# Patient Record
Sex: Male | Born: 1950 | Race: White | Hispanic: No | Marital: Single
Health system: Southern US, Community
[De-identification: ages and names within clinical notes are randomized; demographics above are authoritative.]

## PROBLEM LIST (undated history)

## (undated) DIAGNOSIS — J9621 Acute and chronic respiratory failure with hypoxia: Secondary | ICD-10-CM

## (undated) DIAGNOSIS — N170 Acute kidney failure with tubular necrosis: Secondary | ICD-10-CM

## (undated) DIAGNOSIS — G4733 Obstructive sleep apnea (adult) (pediatric): Secondary | ICD-10-CM

## (undated) DIAGNOSIS — J849 Interstitial pulmonary disease, unspecified: Secondary | ICD-10-CM

## (undated) DIAGNOSIS — J449 Chronic obstructive pulmonary disease, unspecified: Secondary | ICD-10-CM

## (undated) DIAGNOSIS — J4489 Other specified chronic obstructive pulmonary disease: Secondary | ICD-10-CM

## (undated) DIAGNOSIS — J84111 Idiopathic interstitial pneumonia, not otherwise specified: Secondary | ICD-10-CM

---

## 1998-05-04 ENCOUNTER — Ambulatory Visit (HOSPITAL_COMMUNITY): Admission: RE | Admit: 1998-05-04 | Discharge: 1998-05-04 | Payer: Self-pay | Admitting: Neurosurgery

## 1998-05-04 ENCOUNTER — Encounter: Payer: Self-pay | Admitting: Neurosurgery

## 1998-05-05 ENCOUNTER — Encounter: Payer: Self-pay | Admitting: Neurosurgery

## 1998-05-25 ENCOUNTER — Encounter: Payer: Self-pay | Admitting: Neurosurgery

## 1998-05-29 ENCOUNTER — Observation Stay (HOSPITAL_COMMUNITY): Admission: RE | Admit: 1998-05-29 | Discharge: 1998-05-29 | Payer: Self-pay | Admitting: Neurosurgery

## 1998-05-29 ENCOUNTER — Encounter: Payer: Self-pay | Admitting: Neurosurgery

## 2007-04-12 ENCOUNTER — Encounter: Admission: RE | Admit: 2007-04-12 | Discharge: 2007-04-12 | Payer: Self-pay | Admitting: Neurosurgery

## 2007-05-25 ENCOUNTER — Inpatient Hospital Stay (HOSPITAL_COMMUNITY): Admission: RE | Admit: 2007-05-25 | Discharge: 2007-05-28 | Payer: Self-pay | Admitting: Neurosurgery

## 2007-07-28 ENCOUNTER — Encounter: Admission: RE | Admit: 2007-07-28 | Discharge: 2007-07-28 | Payer: Self-pay | Admitting: Neurosurgery

## 2007-08-04 ENCOUNTER — Encounter: Admission: RE | Admit: 2007-08-04 | Discharge: 2007-08-04 | Payer: Self-pay | Admitting: Neurosurgery

## 2007-10-18 ENCOUNTER — Encounter: Admission: RE | Admit: 2007-10-18 | Discharge: 2007-10-18 | Payer: Self-pay | Admitting: Neurosurgery

## 2008-07-07 ENCOUNTER — Encounter: Admission: RE | Admit: 2008-07-07 | Discharge: 2008-07-07 | Payer: Self-pay | Admitting: Neurosurgery

## 2009-08-29 IMAGING — CT CT L SPINE W/ CM
4 of 8 series · 14 of 33 positions shown, 16 images · IV contrast (omnipaque)
Comparison: 08/04/2007 CT

CLINICAL DATA: Low back pain.  Right leg pain.

 MYELOGRAM INJECTION
TECHNIQUE: Informed consent was obtained from the patient prior to
the procedure, including potential complications of headache,
allergy, infection and pain.  A timeout procedure was performed.
With the patient prone, the lower back was prepped with Betadine.
1% Lidocaine was used for local anesthesia.  Lumbar puncture was
performed at the right L1-7level using a 22 gauge needle with
return of clear CSF.  18cc of Omnipaque 474was injected into the
subarachnoid space .
TECHNIQUE: Following injection of intrathecal Omnipaque contrast,
spine imaging in multiple projections was performed using
fluoroscopy.
Fluoroscopy Time: 2 minutes 52 seconds .
TECHNIQUE: CT imaging of the lumbar spine was performed after
intrathecal contrast administration.  Multiplanar CT image
reconstructions were also generated.

[Series 2: l spine · axial · 0.27mm/px · z∈[-270,-155]mm · 3 of 92 slices shown, 4 images]
[im 23/92  soft-tissue]
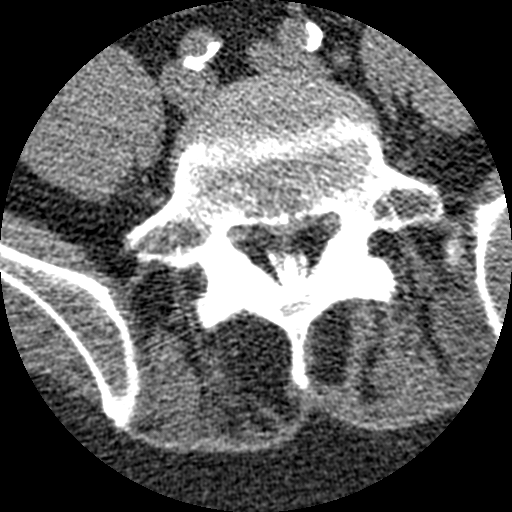
[im 23/92  bone]
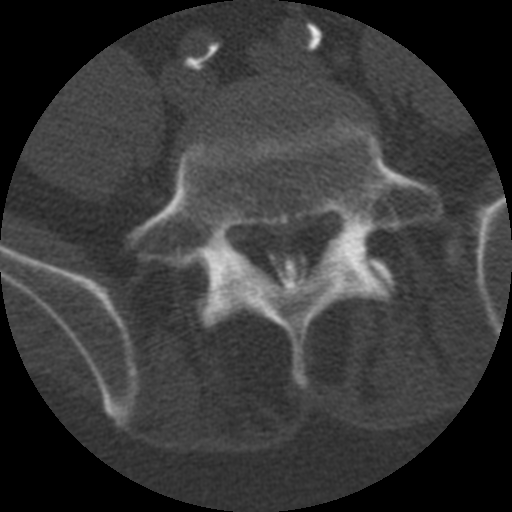
[im 46/92  bone]
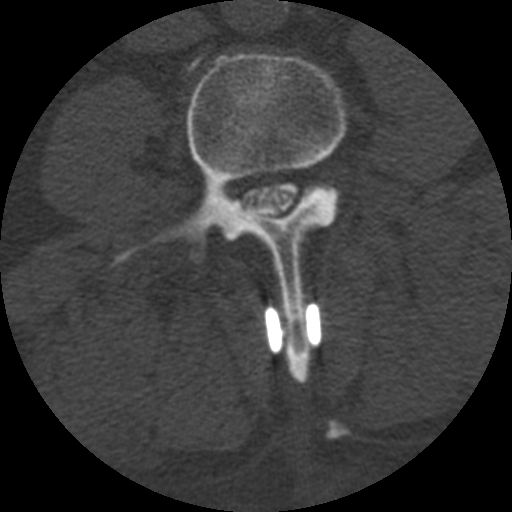
[im 69/92  bone]
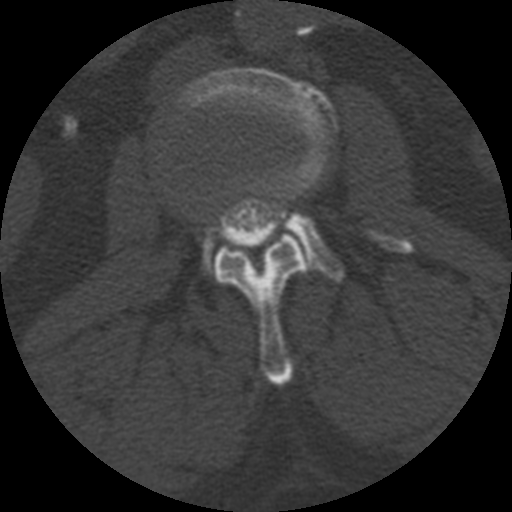

[Series 3: bone windows · axial · 0.27mm/px · z∈[-270,-155]mm · 3 of 92 slices shown]
[im 23/92  bone]
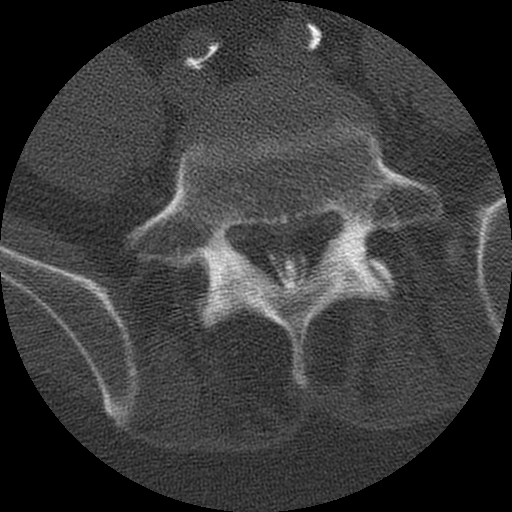
[im 46/92  bone]
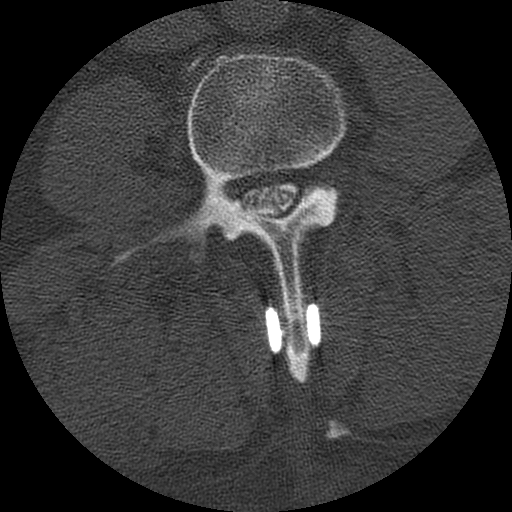
[im 69/92  bone]
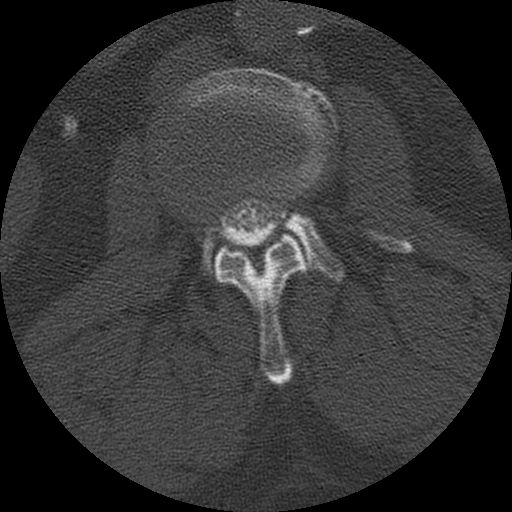

[Series 103: cor · coronal · 0.46mm/px · 3 of 39 slices shown]
[im 8/39  bone]
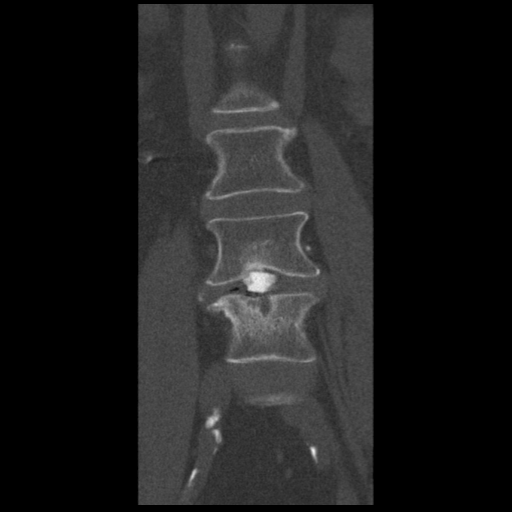
[im 16/39  bone]
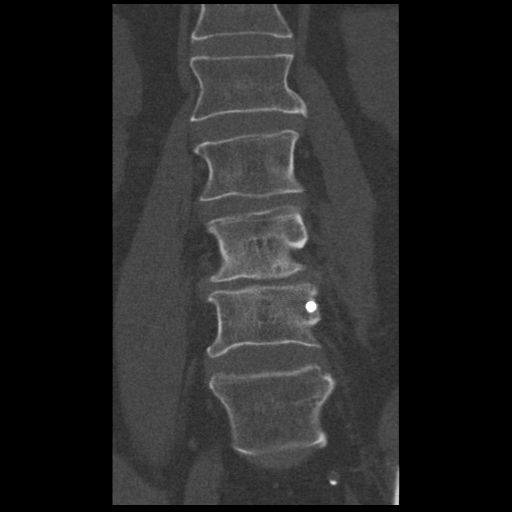
[im 23/39  bone]
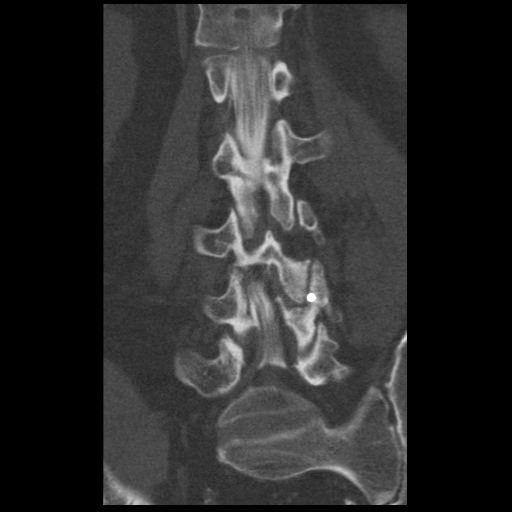

[Series 104: sag · sagittal · 0.46mm/px · 5 of 39 slices shown, 6 images]
[im 13/39  bone]
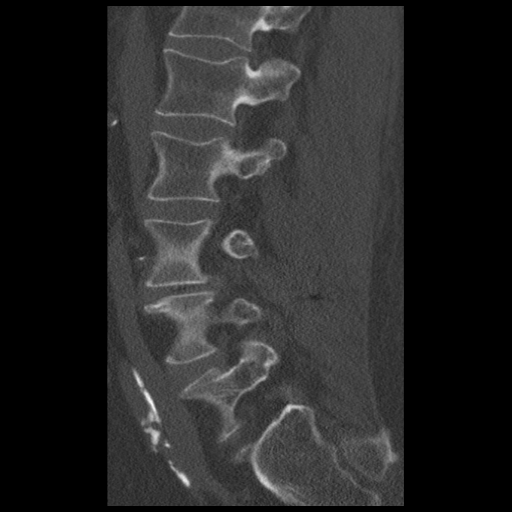
[im 16/39  bone]
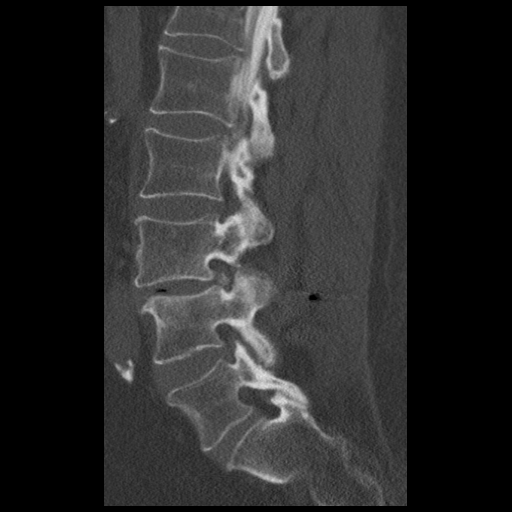
[im 20/39  soft-tissue]
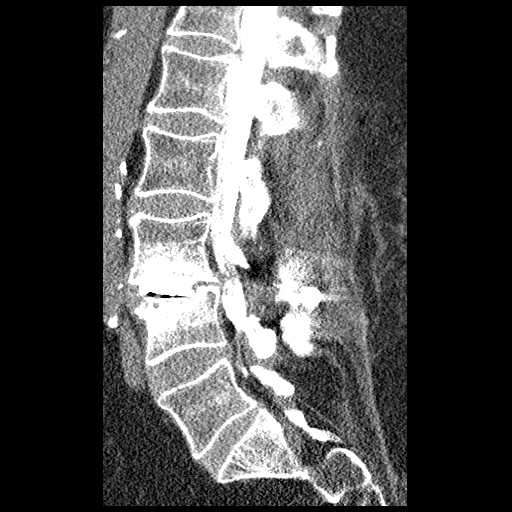
[im 20/39  bone]
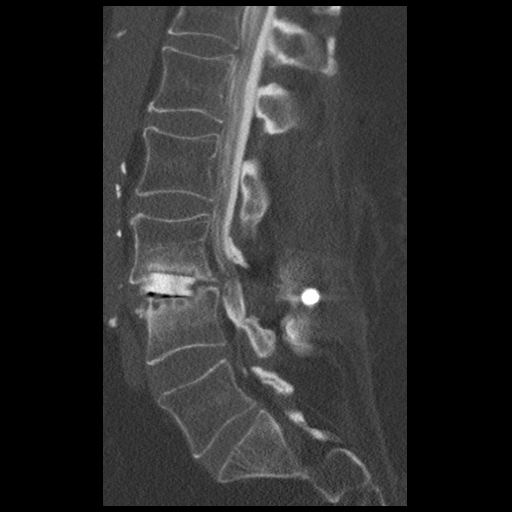
[im 23/39  bone]
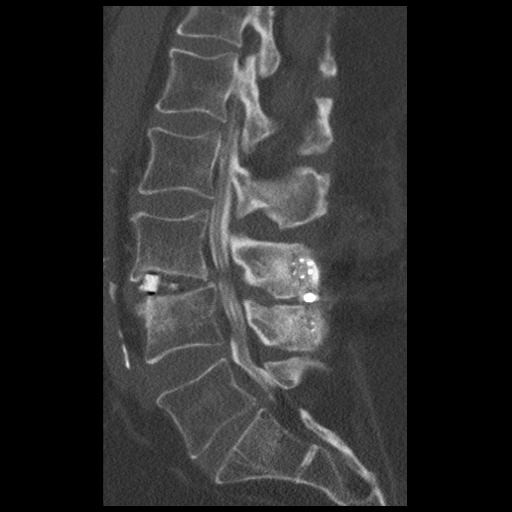
[im 26/39  bone]
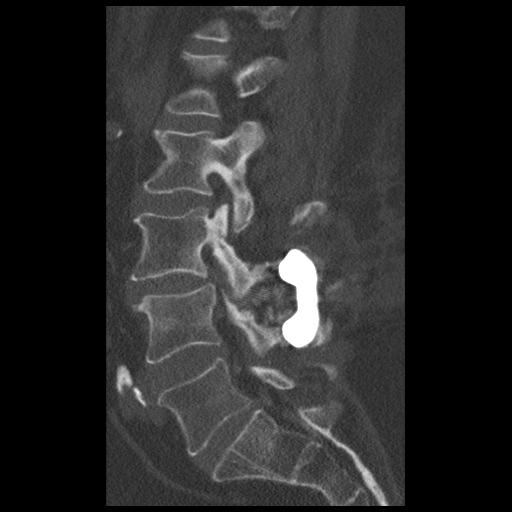

[14 of 33 positions shown; findings below may reference images not displayed]

IMPRESSION: Successful injection of  intrathecal contrast for myelography.

MYELOGRAM LUMBAR
FINDINGS: There is no compressive pathology seen at L2-3 or above.

At L3-4, there is an interspinous device in there is a trans facet
old screw on the left.  Interbody fusion material is in place but
there appears to be lucency in that region suggesting nonunion.
There is anterolisthesis of 3 mm.  There is an anterior extradural
defect.  There is moderate multifactorial stenosis at this level.

At L4-5, there is a small anterior extradural defect but no
apparent compressive stenosis.

At L5-S1, there is a small anterior extradural defect but no
apparent compressive stenosis.

Standing lateral flexion extension views show motion with rocking
at the L3-4 level indicating nonunion.
IMPRESSION: Evidence of nonunion at L4-5.  Multifactorial spinal stenosis.

Small anterior still defect at L4-5 without gross neural
compression being evident at myelography.

CT MYELOGRAPHY LUMBAR SPINE
FINDINGS: T12-L1:  Normal interspace

L1-2:  Normal interspace

L2-3:  Minimal disc bulge.  No compressive stenosis.

L3-4:  Apparent nonunion.  Nitrogen gas adjacent to the interbody
fusion material.  Cystic changes at the endplates adjacent to the
interbody material.  Transfacetal screw on the left shows no
lucency with respect to its extent within L4 but there is lucency
around the posterior aspect of the screw at the inferior facet of
L3.  Interspinous device is in place and appears unremarkable.
There are endplate osteophytes and there is circumferential
protrusion of disc material.  There is been right hemilaminectomy
at this level.  Despite this, there is spinal stenosis that could
well cause neural compression.  Foraminal stenosis on the right
could well compress the L3 nerve root.  There is also foraminal
narrowing on the left though not as severe.

L4-5:  Circumferential bulging of the disc.  Facet and ligamentous
hypertrophy bilaterally.  Narrowing of the lateral recesses without
definite neural compression.

L5-S1:  No significant disc or facet pathology.  No compressive
stenosis.
IMPRESSION: Multifactorial spinal stenosis at L3-4.  Anterolisthesis of 3 mm.
Endplate osteophytes.  Broad-based protrusion of disc material more
towards the right.  Despite previous right hemilaminectomy, neural
compression seems likely at this level.  There is neural foraminal
stenosis right worse than left.  There appears to be nonunion with
nitrogen gas along the margins of the interbody fusion material and
cystic changes within the endplates adjacent to the interbody
material.  Lucency around the portion of the left-sided
transfacetal screw with respect to the inferior articular process
of L3.

Mild narrowing of the lateral recesses at L4-5 without gross neural
compression.

## 2010-10-01 NOTE — Op Note (Signed)
Clinton Bowman, Clinton Bowman              ACCOUNT NO.:  [id number]   MEDICAL RECORD NO.:  [id number]          PATIENT TYPE:  INP   LOCATION:  3172                         FACILITY:  [facility]   PHYSICIAN:  Reinaldo Meeker, M.D. 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   POSTOPERATIVE DIAGNOSIS:  Spondylolisthesis with lateral recess  stenosis, L3-4 right.   PROCEDURE:  Right L3-4 transverse lumbar interbody fusion with NuVasive  bony spacer followed by left transfacet screw fixation and spinous  process instrumentation, L3-4, followed by L3-4 posterolateral fusion.   SURGEON:  Reinaldo Meeker, M.D.   ASSISTANT:  Kathaleen Maser. Pool, M.D.   SURGICAL INDICATIONS:  Mr. Ake is a 60 year old gentleman who has  had a foraminotomy and diskectomy at L3-4 on the right.  He did well  until recently when he developed recurrent back and right leg pain.  He  was tried on conservative therapy without improvement.  He underwent  studies which showed a grade 1 listhesis at L3-4 with lateral recess  stenosis.  After discussing the options the patient requested surgical  intervention and is admitted at this time for L3-4 transverse lumbar  interbody fusion with instrumentation.   PROCEDURE IN DETAIL:  After being placed in the prone position the  patient's back was prepped and draped in usual sterile fashion.  Localizing fluoroscopy was used prior to incision to identify the  appropriate level.  Midline incision was made above the spinous process  of L3 and L4.  Using Bovie cutting current the incision was carried down  to the spinous processes.  Subperiosteal dissection was carried out  bilaterally on the spinous processes, lamina and facet joints.  A self-  retaining retractor was placed for exposure.  X-rays showed approach the  appropriate level.  On the patient's right side edges of the previous  laminotomy were identified, dissected free with a curette.  High-speed  drill was used to widen the laminotomy until the inferior three-quarters  of the L3 lamina, the medial three-quarters of facet joint and the  superior one-half of the L4 lamina were removed.  Residual bone,  ligamentum flavum and scar tissue were removed to identify the L3 and L4  nerve roots until they were well visualized and decompressed.  There was  a rent in the annulus and this was enlarged with a #15 blade and the  disk space was thoroughly cleaned out with pituitary rongeurs and  curettes.  The disk space was then prepared for transverse lumbar body  fusion.  It was distracted up to 10 mm size and then the bony edges were  decorticated with a variety of curettes and rotating cutters.  Prior to  placing in a 10 x 11 x 25 mm bone, autologous bone, Actifuse putty and  BMP were placed deep within the interspace.  This was followed by a  large bony spacer which was followed in good position under fluoroscopy.  At this time,  transfacet screw fixation was carried on the patient's  left side.  Drill hole entry point was made followed by passing a drill  over a guidewire through the inferior facet of L3, the pedicle of L4.  This was followed under AP and lateral fluoroscopy and intraoperative  EMG monitoring was carried out as well and showed no evidence of breech  of the pedicle.  Tapping was then carried out followed by placement of a  35 mm transfacet screw which followed in good position under  fluoroscopy.  A spinous process plate was then selected of the  appropriate size and compressed into position without difficulty.  Final  fluoroscopy in AP and lateral direction showed the interbody spacer,  transfacet screw and spinous process plate to all be in good position.  Large amounts of irrigation were carried out at this time.  The lamina  and  facet joint on the left at L3-4 was decorticated and BMP autologous  bone and Actifuse putty were placed in that area.  Gelfoam was placed  over the dural exposure on the opposite side.  A Hemovac drain was left  in the epidural and extralaminar spaces bilaterally and brought through  a separate stab wound incision.  The wound was then closed in multiple  layers of Vicryl in the muscle, fascia, subcutaneous and subcu tissues  and staples were placed on the skin.  A sterile dressing was then  applied and the patient was extubated, taken to the recovery room in  stable condition.           ______________________________  Reinaldo Meeker, M.D.     ROK/MEDQ  D:  05/25/2007  T:  05/25/2007  Job:  [id number]

## 2011-02-21 LAB — CBC
MCV: 92.7
RBC: 5.03
WBC: 6.8

## 2011-02-21 LAB — PROTIME-INR
INR: 0.9
Prothrombin Time: 11.9

## 2011-02-21 LAB — BASIC METABOLIC PANEL
CO2: 23
Calcium: 9.6
Potassium: 4.8
Sodium: 136

## 2011-02-21 LAB — TYPE AND SCREEN
ABO/RH(D): O POS
Antibody Screen: NEGATIVE

## 2018-11-03 ENCOUNTER — Ambulatory Visit (HOSPITAL_COMMUNITY)
Admission: AD | Admit: 2018-11-03 | Discharge: 2018-11-03 | Disposition: A | Discharge status: Home / Self Care | Payer: Self-pay | Source: Other Acute Inpatient Hospital | Attending: Internal Medicine | Admitting: Internal Medicine

## 2018-11-03 ENCOUNTER — Inpatient Hospital Stay
Admission: RE | Admit: 2018-11-03 | Discharge: 2018-11-25 | Disposition: A | Discharge status: Skilled Nursing Facility | Payer: Medicare HMO | Source: Other Acute Inpatient Hospital | Attending: Internal Medicine | Admitting: Internal Medicine

## 2018-11-03 DIAGNOSIS — R0902 Hypoxemia: Secondary | ICD-10-CM

## 2018-11-03 DIAGNOSIS — J9690 Respiratory failure, unspecified, unspecified whether with hypoxia or hypercapnia: Secondary | ICD-10-CM | POA: Insufficient documentation

## 2018-11-03 DIAGNOSIS — N170 Acute kidney failure with tubular necrosis: Secondary | ICD-10-CM | POA: Diagnosis present

## 2018-11-03 DIAGNOSIS — J9621 Acute and chronic respiratory failure with hypoxia: Secondary | ICD-10-CM | POA: Diagnosis present

## 2018-11-03 DIAGNOSIS — J449 Chronic obstructive pulmonary disease, unspecified: Secondary | ICD-10-CM | POA: Diagnosis present

## 2018-11-03 DIAGNOSIS — G4733 Obstructive sleep apnea (adult) (pediatric): Secondary | ICD-10-CM | POA: Diagnosis present

## 2018-11-03 DIAGNOSIS — J849 Interstitial pulmonary disease, unspecified: Secondary | ICD-10-CM | POA: Diagnosis present

## 2018-11-03 DIAGNOSIS — J84111 Idiopathic interstitial pneumonia, not otherwise specified: Secondary | ICD-10-CM | POA: Diagnosis present

## 2018-11-03 HISTORY — DX: Idiopathic interstitial pneumonia, not otherwise specified: J84.111

## 2018-11-03 HISTORY — DX: Other specified chronic obstructive pulmonary disease: J44.89

## 2018-11-03 HISTORY — DX: Acute kidney failure with tubular necrosis: N17.0

## 2018-11-03 HISTORY — DX: Obstructive sleep apnea (adult) (pediatric): G47.33

## 2018-11-03 HISTORY — DX: Chronic obstructive pulmonary disease, unspecified: J44.9

## 2018-11-03 HISTORY — DX: Interstitial pulmonary disease, unspecified: J84.9

## 2018-11-03 HISTORY — DX: Acute and chronic respiratory failure with hypoxia: J96.21

## 2018-11-04 LAB — COMPREHENSIVE METABOLIC PANEL
ALT: 19 U/L (ref 0–44)
AST: 23 U/L (ref 15–41)
Albumin: 2.5 g/dL — ABNORMAL LOW (ref 3.5–5.0)
Alkaline Phosphatase: 84 U/L (ref 38–126)
Anion gap: 7 (ref 5–15)
BUN: 27 mg/dL — ABNORMAL HIGH (ref 8–23)
CO2: 27 mmol/L (ref 22–32)
Calcium: 8.8 mg/dL — ABNORMAL LOW (ref 8.9–10.3)
Chloride: 107 mmol/L (ref 98–111)
Creatinine, Ser: 1.09 mg/dL (ref 0.61–1.24)
GFR calc Af Amer: >60 mL/min (ref >60)
GFR calc non Af Amer: >60 mL/min (ref >60)
Glucose, Bld: 235 mg/dL — ABNORMAL HIGH (ref 70–99)
Potassium: 4.6 mmol/L (ref 3.5–5.1)
Sodium: 141 mmol/L (ref 135–145)
Total Bilirubin: 0.6 mg/dL (ref 0.3–1.2)
Total Protein: 5.2 g/dL — ABNORMAL LOW (ref 6.5–8.1)

## 2018-11-04 LAB — CBC
HCT: 33.2 % — ABNORMAL LOW (ref 39.0–52.0)
Hemoglobin: 10.2 g/dL — ABNORMAL LOW (ref 13.0–17.0)
MCH: 30.7 pg (ref 26.0–34.0)
MCHC: 30.7 g/dL (ref 30.0–36.0)
MCV: 100 fL (ref 80.0–100.0)
Platelets: 245 10*3/uL (ref 150–400)
RBC: 3.32 MIL/uL — ABNORMAL LOW (ref 4.22–5.81)
RDW: 17.9 % — ABNORMAL HIGH (ref 11.5–15.5)
WBC: 6.8 10*3/uL (ref 4.0–10.5)
nRBC: 1.3 % — ABNORMAL HIGH (ref 0.0–0.2)

## 2018-11-10 LAB — CBC
HCT: 37.7 % — ABNORMAL LOW (ref 39.0–52.0)
Hemoglobin: 11.6 g/dL — ABNORMAL LOW (ref 13.0–17.0)
MCH: 30.6 pg (ref 26.0–34.0)
MCHC: 30.8 g/dL (ref 30.0–36.0)
MCV: 99.5 fL (ref 80.0–100.0)
Platelets: 200 10*3/uL (ref 150–400)
RBC: 3.79 MIL/uL — ABNORMAL LOW (ref 4.22–5.81)
RDW: 16.7 % — ABNORMAL HIGH (ref 11.5–15.5)
WBC: 5.1 10*3/uL (ref 4.0–10.5)
nRBC: 0 % (ref 0.0–0.2)

## 2018-11-10 LAB — BASIC METABOLIC PANEL WITH GFR
Anion gap: 8 (ref 5–15)
BUN: 29 mg/dL — ABNORMAL HIGH (ref 8–23)
CO2: 27 mmol/L (ref 22–32)
Calcium: 8.8 mg/dL — ABNORMAL LOW (ref 8.9–10.3)
Chloride: 108 mmol/L (ref 98–111)
Creatinine, Ser: 1.11 mg/dL (ref 0.61–1.24)
GFR calc Af Amer: >60 mL/min
GFR calc non Af Amer: >60 mL/min
Glucose, Bld: 149 mg/dL — ABNORMAL HIGH (ref 70–99)
Potassium: 4.1 mmol/L (ref 3.5–5.1)
Sodium: 143 mmol/L (ref 135–145)

## 2018-11-12 LAB — VANCOMYCIN, TROUGH: Vancomycin Tr: <4 ug/mL — ABNORMAL LOW (ref 15–20)

## 2018-11-16 ENCOUNTER — Other Ambulatory Visit (HOSPITAL_COMMUNITY): Payer: Medicare HMO

## 2018-11-16 LAB — BASIC METABOLIC PANEL
Anion gap: 9 (ref 5–15)
BUN: 34 mg/dL — ABNORMAL HIGH (ref 8–23)
CO2: 23 mmol/L (ref 22–32)
Calcium: 8.7 mg/dL — ABNORMAL LOW (ref 8.9–10.3)
Chloride: 106 mmol/L (ref 98–111)
Creatinine, Ser: 1.07 mg/dL (ref 0.61–1.24)
GFR calc Af Amer: >60 mL/min (ref >60)
GFR calc non Af Amer: >60 mL/min (ref >60)
Glucose, Bld: 57 mg/dL — ABNORMAL LOW (ref 70–99)
Potassium: 3.8 mmol/L (ref 3.5–5.1)
Sodium: 138 mmol/L (ref 135–145)

## 2018-11-16 LAB — CBC
HCT: 36.6 % — ABNORMAL LOW (ref 39.0–52.0)
Hemoglobin: 11.4 g/dL — ABNORMAL LOW (ref 13.0–17.0)
MCH: 30.6 pg (ref 26.0–34.0)
MCHC: 31.1 g/dL (ref 30.0–36.0)
MCV: 98.1 fL (ref 80.0–100.0)
Platelets: 194 10*3/uL (ref 150–400)
RBC: 3.73 MIL/uL — ABNORMAL LOW (ref 4.22–5.81)
RDW: 15.3 % (ref 11.5–15.5)
WBC: 5.7 10*3/uL (ref 4.0–10.5)
nRBC: 0 % (ref 0.0–0.2)

## 2018-11-16 MED ORDER — MELATONIN 3 MG PO TABS
6.00 | ORAL_TABLET | ORAL | Status: DC
Start: 2018-11-03 — End: 2018-11-16

## 2018-11-16 MED ORDER — INSULIN GLARGINE 100 UNIT/ML ~~LOC~~ SOLN
50.00 | SUBCUTANEOUS | Status: DC
Start: 2018-11-04 — End: 2018-11-16

## 2018-11-16 MED ORDER — ALPRAZOLAM 0.5 MG PO TABS
.50 | ORAL_TABLET | ORAL | Status: DC
Start: 2018-11-03 — End: 2018-11-16

## 2018-11-16 MED ORDER — NYSTATIN 100000 UNIT/GM EX POWD
CUTANEOUS | Status: DC
Start: 2018-11-03 — End: 2018-11-16

## 2018-11-16 MED ORDER — ALBUTEROL SULFATE HFA 108 (90 BASE) MCG/ACT IN AERS
2.00 | INHALATION_SPRAY | RESPIRATORY_TRACT | Status: DC
Start: ? — End: 2018-11-16

## 2018-11-16 MED ORDER — OXIDIZED CELLULOSE EX PADS
1.00 | MEDICATED_PAD | CUTANEOUS | Status: DC
Start: ? — End: 2018-11-16

## 2018-11-16 MED ORDER — HYDROCODONE-ACETAMINOPHEN 5-325 MG PO TABS
1.00 | ORAL_TABLET | ORAL | Status: DC
Start: ? — End: 2018-11-16

## 2018-11-16 MED ORDER — CULTURELLE PO CAPS
2.00 | ORAL_CAPSULE | ORAL | Status: DC
Start: 2018-11-03 — End: 2018-11-16

## 2018-11-16 MED ORDER — ONDANSETRON HCL 4 MG/2ML IJ SOLN
4.00 | INTRAMUSCULAR | Status: DC
Start: ? — End: 2018-11-16

## 2018-11-16 MED ORDER — HEPARIN SODIUM (PORCINE) 5000 UNIT/ML IJ SOLN
5000.00 | INTRAMUSCULAR | Status: DC
Start: 2018-11-03 — End: 2018-11-16

## 2018-11-16 MED ORDER — GENERIC EXTERNAL MEDICATION
500.00 | Status: DC
Start: ? — End: 2018-11-16

## 2018-11-16 MED ORDER — GLUCOSE 40 % PO GEL
15.00 | ORAL | Status: DC
Start: ? — End: 2018-11-16

## 2018-11-16 MED ORDER — HYDROXYZINE HCL 25 MG PO TABS
25.00 | ORAL_TABLET | ORAL | Status: DC
Start: ? — End: 2018-11-16

## 2018-11-16 MED ORDER — FLUTICASONE PROPIONATE 50 MCG/ACT NA SUSP
1.00 | NASAL | Status: DC
Start: ? — End: 2018-11-16

## 2018-11-16 MED ORDER — DEXTROSE 10 % IV SOLN
125.00 | INTRAVENOUS | Status: DC
Start: ? — End: 2018-11-16

## 2018-11-16 MED ORDER — AMLODIPINE BESYLATE 5 MG PO TABS
10.00 | ORAL_TABLET | ORAL | Status: DC
Start: 2018-11-04 — End: 2018-11-16

## 2018-11-16 MED ORDER — UMECLIDINIUM BROMIDE 62.5 MCG/INH IN AEPB
1.00 | INHALATION_SPRAY | RESPIRATORY_TRACT | Status: DC
Start: 2018-11-04 — End: 2018-11-16

## 2018-11-16 MED ORDER — ATORVASTATIN CALCIUM 40 MG PO TABS
80.00 | ORAL_TABLET | ORAL | Status: DC
Start: 2018-11-04 — End: 2018-11-16

## 2018-11-16 MED ORDER — INSULIN LISPRO 100 UNIT/ML ~~LOC~~ SOLN
0.00 | SUBCUTANEOUS | Status: DC
Start: 2018-11-03 — End: 2018-11-16

## 2018-11-16 MED ORDER — TAMSULOSIN HCL 0.4 MG PO CAPS
.40 | ORAL_CAPSULE | ORAL | Status: DC
Start: 2018-11-04 — End: 2018-11-16

## 2018-11-16 MED ORDER — GLUCAGON HCL RDNA (DIAGNOSTIC) 1 MG IJ SOLR
1.00 | INTRAMUSCULAR | Status: DC
Start: ? — End: 2018-11-16

## 2018-11-16 MED ORDER — ASPIRIN EC 81 MG PO TBEC
81.00 | DELAYED_RELEASE_TABLET | ORAL | Status: DC
Start: 2018-11-04 — End: 2018-11-16

## 2018-11-16 MED ORDER — GENERIC EXTERNAL MEDICATION
Status: DC
Start: ? — End: 2018-11-16

## 2018-11-16 MED ORDER — FLUTICASONE FUROATE-VILANTEROL 100-25 MCG/INH IN AEPB
1.00 | INHALATION_SPRAY | RESPIRATORY_TRACT | Status: DC
Start: 2018-11-04 — End: 2018-11-16

## 2018-11-17 ENCOUNTER — Other Ambulatory Visit (HOSPITAL_COMMUNITY): Payer: Medicare HMO

## 2018-11-17 DIAGNOSIS — G4733 Obstructive sleep apnea (adult) (pediatric): Secondary | ICD-10-CM

## 2018-11-17 DIAGNOSIS — J449 Chronic obstructive pulmonary disease, unspecified: Secondary | ICD-10-CM

## 2018-11-17 DIAGNOSIS — J84111 Idiopathic interstitial pneumonia, not otherwise specified: Secondary | ICD-10-CM | POA: Diagnosis not present

## 2018-11-17 DIAGNOSIS — J9621 Acute and chronic respiratory failure with hypoxia: Secondary | ICD-10-CM | POA: Diagnosis not present

## 2018-11-17 DIAGNOSIS — N170 Acute kidney failure with tubular necrosis: Secondary | ICD-10-CM

## 2018-11-17 DIAGNOSIS — J849 Interstitial pulmonary disease, unspecified: Secondary | ICD-10-CM

## 2018-11-17 MED ORDER — GENERIC EXTERNAL MEDICATION
Status: DC
Start: ? — End: 2018-11-17

## 2018-11-17 MED ORDER — IOHEXOL 300 MG/ML  SOLN
75.0000 mL | Freq: Once | INTRAMUSCULAR | Status: AC | PRN
  Administered 2018-11-17: 75 mL via INTRAVENOUS

## 2018-11-17 NOTE — Consult Note (Signed)
[facility] [place]  PULMONARY SERVICE  Date of Service: 11/17/2018  PULMONARY CRITICAL CARE CONSULT   Clinton Bowman  BZJ:[id number]  DOB: 10/20/1950   DOA: 11/03/2018  Referring Physician: Merton Border, MD  HPI: Clinton Bowman is a 68 y.o. male seen for follow up of Acute on Chronic Respiratory Failure.  Patient has past medical history significant for type 2 diabetes hypertension hyperlipidemia stage III kidney disease chronic venous sufficiency stasis dermatitis among other problems who presented to the hospital because of generalized weakness and bedsores.  Patient was initially found to have a glucose of 24 by the EMS was just profoundly weak.  In the ED was worked up found to have bilateral lower extremity edema and erythema following.  Source.  Chest x-ray showed a right lower lobe pneumonia.  Patient was tested for COVID-19 which turned out to be negative.  Patient was started on treatment for pneumonia with antibiotics Levaquin and Diflucan was added.  He was initially placed on OptiFlow because of significant hypoxia.  Scan was done for DVT which was negative.  Through his hospital stay he was noted to have increased oxygen requirements was turned up to 12 L high flow with saturations of 92%.  He was also started on CPAP for history of OSA apparently.  His oxygen requirements however did not improve and he was dependent on 80% FiO2.  Patient came to our facility has been on 10 L oxygen.  Chest x-ray showing persistence of interstitial type changes.  He did have a CT scan of the chest at the other facility which I was able to look at from about 2 weeks ago which showed pneumonia with chronic interstitial lung disease changes.  Review of Systems:  ROS performed and is unremarkable other than noted above.  Medical History: Past Medical History:  Diagnosis Date  . Arthritis  . BPH (benign prostatic hyperplasia)  . Bronchitis  . CHF (congestive  heart failure) ([place])  . Colon polyp  . COPD (chronic obstructive pulmonary disease) ([place])  . Duodenal ulcer 12/31/2014  Penetrating anterior duodenal ulcer, Evident during lap cholecystectomy; oversewn.  Marland Kitchen Respiratory failure ([place])  . Stroke [facility])   Surgical History: Past Surgical History:  Procedure Laterality Date  . BACK SURGERY  x2, fusion, laminectomy.  . CHOLECYSTECTOMY 12/30/2014  Procedure: LAPAROSCOPY, SURGICAL; CHOLECYSTECTOMY; Surgeon: Demetrius Revel, MD; Location: OR [place]; Service: General Surgery  . Transendoscopic Balloon Dilation of Biliary/Pancreatic Duct 12/29/2014  Procedure: ERCP;WITH TRANS-ENDOSCOPIC BALLOON DILATION OF BILIARY/PANCREATIC DUCT(S) OR OF AMPULLA, INCLUDING SPHINCTERECTOMY, WHEN PERFOREMD,EACH DUCT ([id number]); Surgeon: Rozelle Logan, MD; Location: [facility]; Service: Gastroenterology   Social History: Social History   Socioeconomic History  . Marital status: Divorced  Spouse name: Not on file  . Number of children: Not on file  . Years of education: 11th Grade  . Highest education level: Not on file  Occupational History  . Occupation: Retired  Scientific laboratory technician  . Financial resource strain: Not on file  . Food insecurity  Worry: Not on file  Inability: Not on file  . Transportation needs  Medical: Not on file  Non-medical: Not on file  Tobacco Use  . Smoking status: Former Smoker  Types: Cigarettes  Last attempt to quit: 12/16/2013  Years since quitting: 4.8  . Smokeless tobacco: Never Used  Substance and Sexual Activity  . Alcohol use: No  . Drug use: No    Medications: Reviewed on Rounds  Physical Exam:  Vitals: Temperature 97.3 pulse  85 respiratory 18 blood pressure 05/22/1962 saturations 95%  Ventilator Settings currently off the ventilator on 10 L  . General: Comfortable at this time . Eyes: Grossly normal lids, irises & conjunctiva . ENT: grossly tongue is normal . Neck: no obvious mass . Cardiovascular:  S1-S2 normal no gallop or rub . Respiratory: No rhonchi coarse breath sounds are noted . Abdomen: Soft and nontender . Skin: no rash seen on limited exam . Musculoskeletal: not rigid . Psychiatric:unable to assess . Neurologic: no seizure no involuntary movements         Labs on Admission:  Basic Metabolic Panel: Recent Labs  Lab 11/16/18 0540  NA 138  K 3.8  CL 106  CO2 23  GLUCOSE 57*  BUN 34*  CREATININE 1.07  CALCIUM 8.7*    No results for input(s): PHART, PCO2ART, PO2ART, HCO3, O2SAT in the last 168 hours.  Liver Function Tests: No results for input(s): AST, ALT, ALKPHOS, BILITOT, PROT, ALBUMIN in the last 168 hours. No results for input(s): LIPASE, AMYLASE in the last 168 hours. No results for input(s): AMMONIA in the last 168 hours.  CBC: Recent Labs  Lab 11/16/18 0540  WBC 5.7  HGB 11.4*  HCT 36.6*  MCV 98.1  PLT 194    Cardiac Enzymes: No results for input(s): CKTOTAL, CKMB, CKMBINDEX, TROPONINI in the last 168 hours.  BNP (last 3 results) No results for input(s): BNP in the last 8760 hours.  ProBNP (last 3 results) No results for input(s): PROBNP in the last 8760 hours.   Radiological Exams on Admission: Dg Chest Port 1 View  Result Date: 11/16/2018 CLINICAL DATA:  Hypoxia EXAM: PORTABLE CHEST 1 VIEW COMPARISON:  11/03/2018 FINDINGS: Cardiac shadow is stable. Persistent but improved infiltrate is noted in the right base. No other focal abnormality is noted. IMPRESSION: Improving infiltrate in the right base. Electronically Signed   By: Alcide CleverMark  Lukens M.D.   On: 11/16/2018 11:07    Assessment/Plan Active Problems:   Acute on chronic respiratory failure with hypoxia (HCC)   Acute renal failure due to tubular necrosis (HCC)   Obstructive chronic bronchitis without exacerbation (HCC)   Obstructive sleep apnea   Interstitial lung disease (HCC)   Chronic interstitial pneumonia (HCC)   1. Acute on chronic respiratory failure with hypoxia patient  has been on 15 L high flow 80% FiO2 at the other facility he is now on 10 L Oxymizer.  Review of the chart shows that he does have a history of COPD and has been on oxygen therapy at home 2 L.  He does have significant debility and changes noted on the chest x-ray.  CT scan of the chest was recommended and ordered. 2. Acute renal failure tubular necrosis this is been improving we will continue to follow the labs. 3. COPD unknown severity but the patient was on oxygen but was dependent on oxygen prior to admission so I would assume that the disease process is severe continue with nebulizers as necessary. 4. Obstructive sleep apnea use the positive airway pressure for sleep 5. Persistent pneumonia versus interstitial lung disease CT scan of the chest was ordered  I have personally seen and evaluated the patient, evaluated laboratory and imaging results, formulated the assessment and plan and placed orders. The Patient requires high complexity decision making for assessment and support.  Case was discussed on Rounds with the Respiratory Therapy Staff Time Spent 70minutes  Yevonne PaxSaadat A , MD [facility] Pulmonary Critical Care Medicine Sleep Medicine

## 2018-11-18 ENCOUNTER — Encounter: Payer: Self-pay | Admitting: Internal Medicine

## 2018-11-18 DIAGNOSIS — J849 Interstitial pulmonary disease, unspecified: Secondary | ICD-10-CM | POA: Diagnosis not present

## 2018-11-18 DIAGNOSIS — J9621 Acute and chronic respiratory failure with hypoxia: Secondary | ICD-10-CM | POA: Diagnosis present

## 2018-11-18 DIAGNOSIS — J84111 Idiopathic interstitial pneumonia, not otherwise specified: Secondary | ICD-10-CM | POA: Diagnosis not present

## 2018-11-18 DIAGNOSIS — N170 Acute kidney failure with tubular necrosis: Secondary | ICD-10-CM | POA: Diagnosis present

## 2018-11-18 DIAGNOSIS — J4489 Other specified chronic obstructive pulmonary disease: Secondary | ICD-10-CM | POA: Diagnosis present

## 2018-11-18 DIAGNOSIS — G4733 Obstructive sleep apnea (adult) (pediatric): Secondary | ICD-10-CM | POA: Diagnosis present

## 2018-11-18 DIAGNOSIS — J449 Chronic obstructive pulmonary disease, unspecified: Secondary | ICD-10-CM | POA: Diagnosis present

## 2018-11-18 NOTE — Progress Notes (Addendum)
Pulmonary Critical Care Medicine [place]   PULMONARY CRITICAL CARE SERVICE  PROGRESS NOTE  Date of Service: 11/18/2018  Clinton Bowman  TKP:[id number]  DOB: [birth year]   DOA: 11/03/2018  Referring Physician: Merton Border, MD  HPI: Clinton Bowman is a 67 y.o. male seen for follow up of Acute on Chronic Respiratory Failure.  Patient remains on 2 L via Oxymizer at this time satting well with no distress.  Medications: Reviewed on Rounds  Physical Exam:  Vitals: Pulse 70 respirations 17 BP 135/77 O2 sat 99% temp 97.1  Ventilator Settings 10 L Oxymizer  . General: Comfortable at this time . Eyes: Grossly normal lids, irises & conjunctiva . ENT: grossly tongue is normal . Neck: no obvious mass . Cardiovascular: S1 S2 normal no gallop . Respiratory: No rales rhonchi noted . Abdomen: soft . Skin: no rash seen on limited exam . Musculoskeletal: not rigid . Psychiatric:unable to assess . Neurologic: no seizure no involuntary movements         Lab Data:   Basic Metabolic Panel: Recent Labs  Lab 11/16/18 0540  NA 138  K 3.8  CL 106  CO2 23  GLUCOSE 57*  BUN 34*  CREATININE 1.07  CALCIUM 8.7*    ABG: No results for input(s): PHART, PCO2ART, PO2ART, HCO3, O2SAT in the last 168 hours.  Liver Function Tests: No results for input(s): AST, ALT, ALKPHOS, BILITOT, PROT, ALBUMIN in the last 168 hours. No results for input(s): LIPASE, AMYLASE in the last 168 hours. No results for input(s): AMMONIA in the last 168 hours.  CBC: Recent Labs  Lab 11/16/18 0540  WBC 5.7  HGB 11.4*  HCT 36.6*  MCV 98.1  PLT 194    Cardiac Enzymes: No results for input(s): CKTOTAL, CKMB, CKMBINDEX, TROPONINI in the last 168 hours.  BNP (last 3 results) No results for input(s): BNP in the last 8760 hours.  ProBNP (last 3 results) No results for input(s): PROBNP in the last 8760 hours.  Radiological Exams: Ct Chest W Contrast  Result Date: 11/17/2018 CLINICAL  DATA:  68 year old male with respiratory failure. EXAM: CT CHEST WITH CONTRAST TECHNIQUE: Multidetector CT imaging of the chest was performed during intravenous contrast administration. CONTRAST:  55mL OMNIPAQUE IOHEXOL 300 MG/ML  SOLN COMPARISON:  [facility] CTA chest 10/30/2018 and earlier. FINDINGS: Cardiovascular: Calcified coronary artery atherosclerosis. Comparatively little atherosclerosis in the visible aorta. Stable mild cardiomegaly. No pericardial effusion. Mediastinum/Nodes: Reactive appearing mediastinal lymph nodes have regressed since last month. Lungs/Pleura: Small layering pleural effusions have regressed. Major airways remain patent. Centrilobular emphysema. Regressed but not resolved confluent airspace opacity in the posterior and inferior upper lobes, residual greater on the right. Mildly increased dependent opacity in the left lower lobe now. Improved ventilation in the superior segment right lower lobe. Upper Abdomen: Negative visible liver, spleen, pancreas, adrenal glands and stomach. Musculoskeletal: Stable visualized osseous structures. IMPRESSION: 1. Regressed but not resolved bilateral pneumonia since the CTA last month. Decreased reactive mediastinal lymph nodes. Small layering pleural effusions have also regressed. 2. Mildly increased dependent opacity in the left lower lobe, favor atelectasis. 3. Emphysema (ICD10-J43.9). Calcified coronary artery atherosclerosis. Electronically Signed   By: Genevie Ann M.D.   On: 11/17/2018 16:22    Assessment/Plan Active Problems:   Acute on chronic respiratory failure with hypoxia (HCC)   Acute renal failure due to tubular necrosis (HCC)   Obstructive chronic bronchitis without exacerbation (HCC)   Obstructive sleep apnea   Interstitial  lung disease (HCC)   Chronic interstitial pneumonia (HCC)   1. Acute on chronic respiratory failure with hypoxia patient will continue with Oxymizer at 10 L.  We will  continue to titrate as tolerated.  Continue supportive measures pulmonary toilet at this time.   2. Acute renal failure tubular necrosis this is been improving we will continue to follow the labs. 3. COPD unknown severity but the patient was on oxygen but was dependent on oxygen prior to admission so I would assume that the disease process is severe continue with nebulizers as necessary. 4. Obstructive sleep apnea use the positive airway pressure for sleep 5. Persistent pneumonia versus interstitial lung disease CT scan of the chest was ordered   I have personally seen and evaluated the patient, evaluated laboratory and imaging results, formulated the assessment and plan and placed orders. The Patient requires high complexity decision making for assessment and support.  Case was discussed on Rounds with the Respiratory Therapy Staff  Yevonne PaxSaadat A Khan, MD [facility] Pulmonary Critical Care Medicine Sleep Medicine

## 2018-11-21 LAB — BASIC METABOLIC PANEL
Anion gap: 8 (ref 5–15)
BUN: 37 mg/dL — ABNORMAL HIGH (ref 8–23)
CO2: 27 mmol/L (ref 22–32)
Calcium: 9.2 mg/dL (ref 8.9–10.3)
Chloride: 106 mmol/L (ref 98–111)
Creatinine, Ser: 0.97 mg/dL (ref 0.61–1.24)
GFR calc Af Amer: >60 mL/min (ref >60)
GFR calc non Af Amer: >60 mL/min (ref >60)
Glucose, Bld: 208 mg/dL — ABNORMAL HIGH (ref 70–99)
Potassium: 4.7 mmol/L (ref 3.5–5.1)
Sodium: 141 mmol/L (ref 135–145)

## 2018-11-21 LAB — CBC
HCT: 40.3 % (ref 39.0–52.0)
Hemoglobin: 12.7 g/dL — ABNORMAL LOW (ref 13.0–17.0)
MCH: 30.8 pg (ref 26.0–34.0)
MCHC: 31.5 g/dL (ref 30.0–36.0)
MCV: 97.6 fL (ref 80.0–100.0)
Platelets: 222 10*3/uL (ref 150–400)
RBC: 4.13 MIL/uL — ABNORMAL LOW (ref 4.22–5.81)
RDW: 14.2 % (ref 11.5–15.5)
WBC: 7.8 10*3/uL (ref 4.0–10.5)
nRBC: 0 % (ref 0.0–0.2)

## 2018-11-24 LAB — NOVEL CORONAVIRUS, NAA (HOSP ORDER, SEND-OUT TO REF LAB; TAT 18-24 HRS): SARS-CoV-2, NAA: NOT DETECTED

## 2019-04-19 DEATH — deceased

## 2020-01-08 IMAGING — DX PORTABLE CHEST - 1 VIEW
1 series · 1 of 1 positions shown · non-contrast
Comparison: 11/03/2018

CLINICAL DATA: Hypoxia

EXAM:
PORTABLE CHEST 1 VIEW

[chest ap]
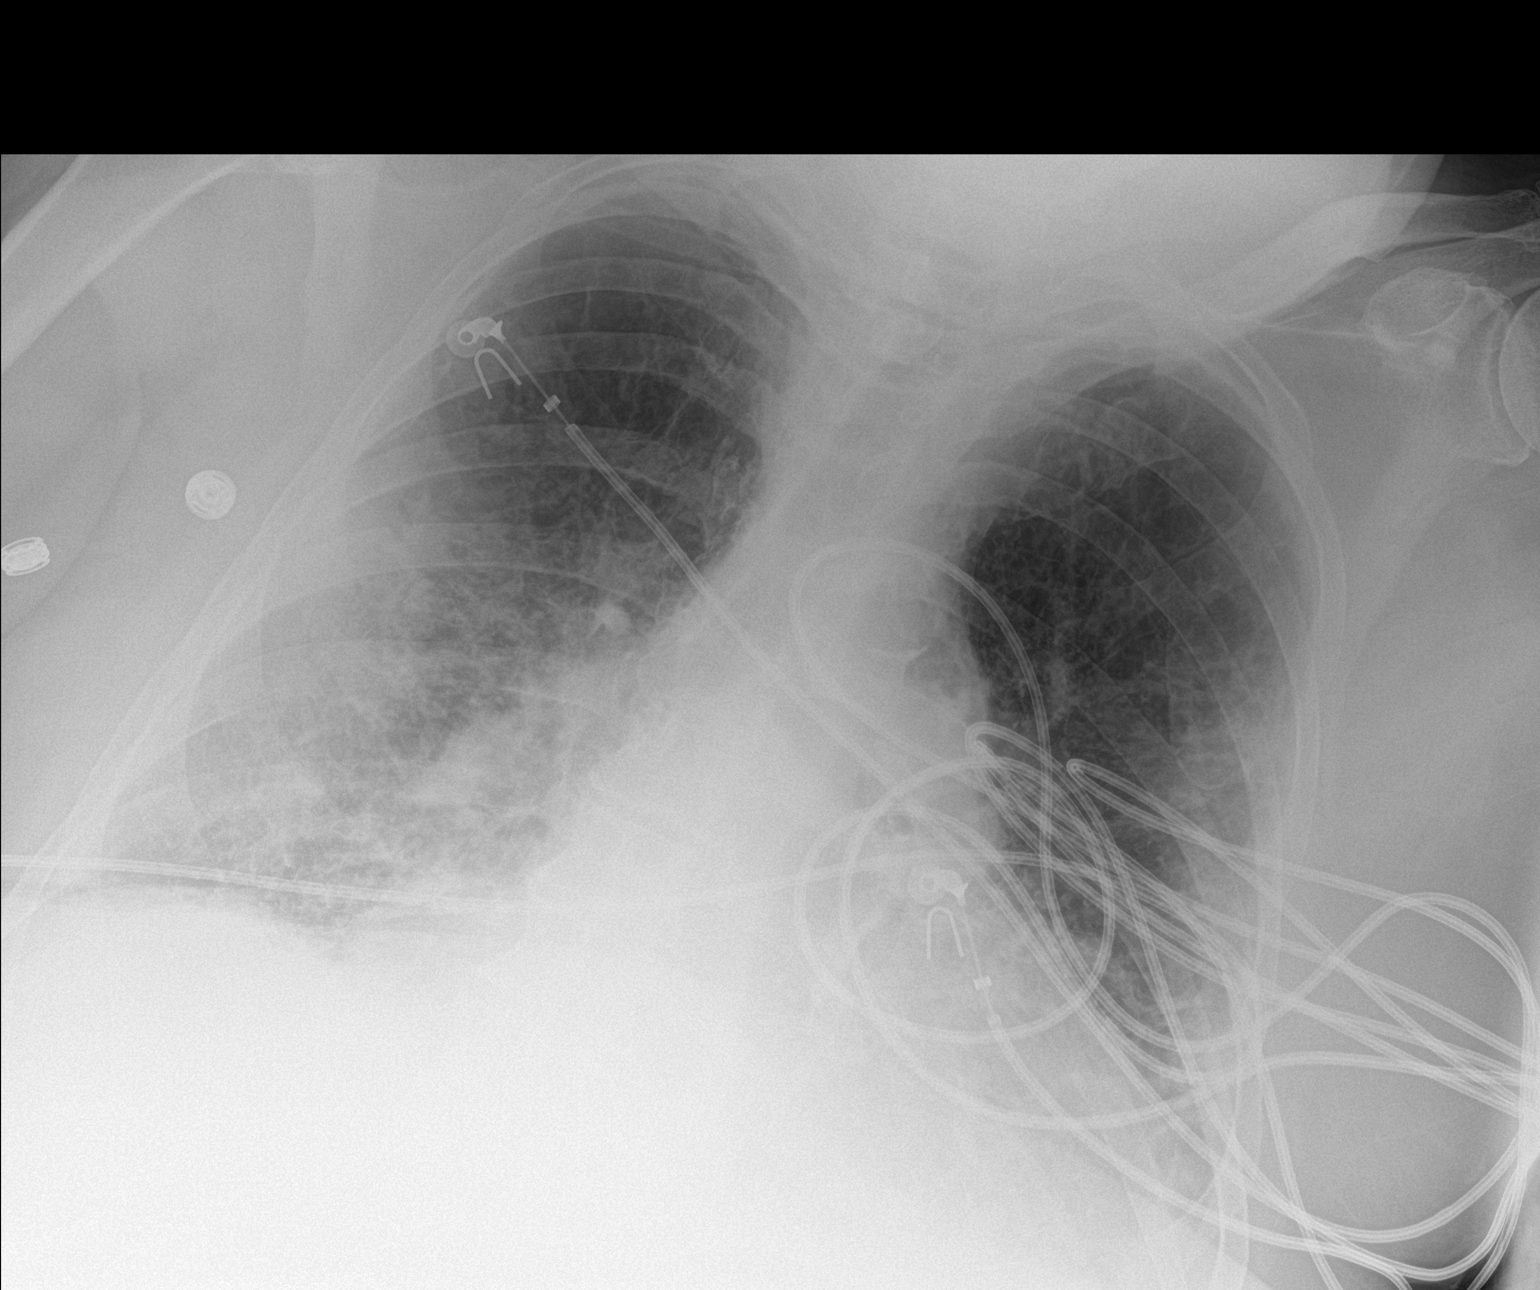

[1 of 1 positions shown; findings below may reference images not displayed]

FINDINGS: Cardiac shadow is stable. Persistent but improved infiltrate is
noted in the right base. No other focal abnormality is noted.
IMPRESSION: Improving infiltrate in the right base.
